# Patient Record
Sex: Male | Born: 1970 | Marital: Married | Smoking: Never smoker
Health system: Southern US, Community
[De-identification: ages and names within clinical notes are randomized; demographics above are authoritative.]

## PROBLEM LIST (undated history)

## (undated) DIAGNOSIS — N201 Calculus of ureter: Secondary | ICD-10-CM

## (undated) DIAGNOSIS — J45909 Unspecified asthma, uncomplicated: Secondary | ICD-10-CM

## (undated) DIAGNOSIS — N200 Calculus of kidney: Secondary | ICD-10-CM

## (undated) DIAGNOSIS — F419 Anxiety disorder, unspecified: Secondary | ICD-10-CM

## (undated) DIAGNOSIS — J302 Other seasonal allergic rhinitis: Secondary | ICD-10-CM

## (undated) HISTORY — DX: Anxiety disorder, unspecified: F41.9

## (undated) HISTORY — PX: THUMB ARTHROSCOPY: SHX2509

## (undated) HISTORY — DX: Calculus of kidney: N20.0

## (undated) HISTORY — PX: OTHER SURGICAL HISTORY: SHX169

## (undated) HISTORY — DX: Calculus of ureter: N20.1

## (undated) HISTORY — PX: WISDOM TOOTH EXTRACTION: SHX21

---

## 2014-12-27 ENCOUNTER — Encounter
Admission: RE | Disposition: A | Discharge status: Home / Self Care | Payer: Self-pay | Source: Ambulatory Visit | Attending: Urology

## 2014-12-27 ENCOUNTER — Ambulatory Visit
Admission: RE | Admit: 2014-12-27 | Discharge: 2014-12-27 | Disposition: A | Discharge status: Home / Self Care | Payer: BC Managed Care – PPO | Source: Ambulatory Visit | Attending: Urology | Admitting: Urology

## 2014-12-27 ENCOUNTER — Encounter: Payer: Self-pay | Admitting: *Deleted

## 2014-12-27 DIAGNOSIS — Z79899 Other long term (current) drug therapy: Secondary | ICD-10-CM | POA: Insufficient documentation

## 2014-12-27 DIAGNOSIS — Z7951 Long term (current) use of inhaled steroids: Secondary | ICD-10-CM | POA: Insufficient documentation

## 2014-12-27 DIAGNOSIS — N200 Calculus of kidney: Secondary | ICD-10-CM | POA: Diagnosis present

## 2014-12-27 HISTORY — DX: Unspecified asthma, uncomplicated: J45.909

## 2014-12-27 HISTORY — PX: EXTRACORPOREAL SHOCK WAVE LITHOTRIPSY: SHX1557

## 2014-12-27 HISTORY — DX: Other seasonal allergic rhinitis: J30.2

## 2014-12-27 SURGERY — LITHOTRIPSY, ESWL
Anesthesia: Moderate Sedation | Laterality: Left

## 2014-12-27 MED ORDER — DIAZEPAM 5 MG PO TABS
10.0000 mg | ORAL_TABLET | ORAL | Status: AC
  Administered 2014-12-27: 10 mg via ORAL

## 2014-12-27 MED ORDER — CIPROFLOXACIN HCL 500 MG PO TABS
500.0000 mg | ORAL_TABLET | ORAL | Status: AC
  Administered 2014-12-27: 500 mg via ORAL

## 2014-12-27 MED ORDER — DIAZEPAM 5 MG PO TABS
ORAL_TABLET | ORAL | Status: AC
  Administered 2014-12-27: 10 mg via ORAL
  Filled 2014-12-27: qty 2

## 2014-12-27 MED ORDER — DIPHENHYDRAMINE HCL 25 MG PO CAPS
ORAL_CAPSULE | ORAL | Status: AC
  Administered 2014-12-27: 25 mg via ORAL
  Filled 2014-12-27: qty 1

## 2014-12-27 MED ORDER — DEXTROSE-NACL 5-0.45 % IV SOLN
INTRAVENOUS | Status: DC
  Administered 2014-12-27: 07:00:00 via INTRAVENOUS

## 2014-12-27 MED ORDER — CIPROFLOXACIN HCL 500 MG PO TABS
ORAL_TABLET | ORAL | Status: AC
  Administered 2014-12-27: 500 mg via ORAL
  Filled 2014-12-27: qty 1

## 2014-12-27 MED ORDER — DIPHENHYDRAMINE HCL 25 MG PO CAPS
25.0000 mg | ORAL_CAPSULE | ORAL | Status: AC
  Administered 2014-12-27: 25 mg via ORAL

## 2014-12-28 ENCOUNTER — Other Ambulatory Visit: Payer: Self-pay | Admitting: Urology

## 2014-12-28 ENCOUNTER — Ambulatory Visit
Admission: RE | Admit: 2014-12-28 | Discharge: 2014-12-28 | Disposition: A | Discharge status: Home / Self Care | Payer: BC Managed Care – PPO | Source: Ambulatory Visit | Attending: Urology | Admitting: Urology

## 2014-12-28 DIAGNOSIS — N200 Calculus of kidney: Secondary | ICD-10-CM | POA: Diagnosis present

## 2014-12-28 MED ORDER — IOHEXOL 300 MG/ML  SOLN
125.0000 mL | Freq: Once | INTRAMUSCULAR | Status: AC | PRN
  Administered 2014-12-28: 125 mL via INTRAVENOUS

## 2015-01-04 ENCOUNTER — Encounter: Payer: Self-pay | Admitting: Urology

## 2015-01-04 NOTE — H&P (Signed)
H and P done on paper please scan into chart at hospital  This is an ESWL patient

## 2015-04-12 ENCOUNTER — Encounter: Payer: Self-pay | Admitting: Urology

## 2020-11-04 ENCOUNTER — Telehealth: Payer: Self-pay | Admitting: Urology

## 2020-11-04 NOTE — Telephone Encounter (Signed)
-----   Message from [name], MD sent at 11/03/2020  9:37 PM EDT ----- Regarding: appt One of the [facility] urologists contacted me about scheduling ESWL on this patient.  Please schedule appointment with me on Wednesday

## 2020-11-04 NOTE — Telephone Encounter (Signed)
LM TO CB TO CONFIRM APP  MICHELLE 

## 2020-11-05 ENCOUNTER — Encounter: Payer: Self-pay | Admitting: Urology

## 2020-11-05 ENCOUNTER — Ambulatory Visit: Payer: BC Managed Care – PPO | Admitting: Urology

## 2020-11-05 ENCOUNTER — Other Ambulatory Visit: Payer: Self-pay

## 2020-11-05 VITALS — BP 100/62 | HR 72 | Ht 69.0 in | Wt 160.0 lb

## 2020-11-05 DIAGNOSIS — N201 Calculus of ureter: Secondary | ICD-10-CM

## 2020-11-05 DIAGNOSIS — N200 Calculus of kidney: Secondary | ICD-10-CM | POA: Diagnosis not present

## 2020-11-05 LAB — MICROSCOPIC EXAMINATION
Bacteria, UA: NONE SEEN
Epithelial Cells (non renal): NONE SEEN /hpf (ref 0–10)

## 2020-11-05 LAB — URINALYSIS, COMPLETE
Bilirubin, UA: NEGATIVE
Glucose, UA: NEGATIVE
Ketones, UA: NEGATIVE
Nitrite, UA: NEGATIVE
Protein,UA: NEGATIVE
RBC, UA: NEGATIVE
Specific Gravity, UA: 1.01 (ref 1.005–1.030)
Urobilinogen, Ur: 0.2 mg/dL (ref 0.2–1.0)
pH, UA: 7 (ref 5.0–7.5)

## 2020-11-05 NOTE — Patient Instructions (Signed)
[employer] (25th ed., pp. 811-816). [place], [place]: [employer], Else[employer]. Retrieved from https://www.clinicalkey.com/#!/content/book/3-s2.0-B9781455750177001264?scrollTo=%23hl0000287">  Lithotripsy  Lithotripsy is a treatment that can help break up kidney stones that are too large to pass on their own. This is a nonsurgical procedure that crushes a kidney stone with shock waves. These shock waves pass through your body and focus on the kidney stone. They cause the kidney stone to break up into smaller pieces while it is still in the urinary tract. The smaller pieces of stone can pass more easily out of your body in the urine. Tell a health care provider about:  Any allergies you have.  All medicines you are taking, including vitamins, herbs, eye drops, creams, and over-the-counter medicines.  Any problems you or family members have had with anesthetic medicines.  Any blood disorders you have.  Any surgeries you have had.  Any medical conditions you have.  Whether you are pregnant or may be pregnant. What are the risks? Generally, this is a safe procedure. However, problems may occur, including:  Infection.  Bleeding from the kidney.  Bruising of the kidney or skin.  Scarring of the kidney, which can lead to: ? Increased blood pressure. ? Poor kidney function. ? Return (recurrence) of kidney stones.  Damage to other structures or organs, such as the liver, colon, spleen, or pancreas.  Blockage (obstruction) of the tube that carries urine from the kidney to the bladder (ureter).  Failure of the kidney stone to break into pieces (fragments). What happens before the procedure? Staying hydrated Follow instructions from your health care provider about hydration, which may include:  Up to 2 hours before the procedure - you may continue to drink clear liquids, such as water, clear fruit juice, black coffee, and plain tea. Eating and drinking restrictions Follow  instructions from your health care provider about eating and drinking, which may include:  8 hours before the procedure - stop eating heavy meals or foods, such as meat, fried foods, or fatty foods.  6 hours before the procedure - stop eating light meals or foods, such as toast or cereal.  6 hours before the procedure - stop drinking milk or drinks that contain milk.  2 hours before the procedure - stop drinking clear liquids. Medicines Ask your health care provider about:  Changing or stopping your regular medicines. This is especially important if you are taking diabetes medicines or blood thinners.  Taking medicines such as aspirin and ibuprofen. These medicines can thin your blood. Do not take these medicines unless your health care provider tells you to take them.  Taking over-the-counter medicines, vitamins, herbs, and supplements. Tests You may have tests, such as:  Blood tests.  Urine tests.  Imaging tests, such as a CT scan. General instructions  Plan to have someone take you home from the hospital or clinic.  If you will be going home right after the procedure, plan to have someone with you for 24 hours.  Ask your health care provider what steps will be taken to help prevent infection. These may include washing skin with a germ-killing soap. What happens during the procedure?  An IV will be inserted into one of your veins.  You will be given one or more of the following: ? A medicine to help you relax (sedative). ? A medicine to make you fall asleep (general anesthetic).  A water-filled cushion may be placed behind your kidney or on your abdomen. In some cases, you may be placed in a tub of   lukewarm water.  Your body will be positioned in a way that makes it easy to target the kidney stone.  An X-ray or ultrasound exam will be done to locate your stone.  Shock waves will be aimed at the stone. If you are awake, you may feel a tapping sensation as the shock  waves pass through your body.  A flexible tube with holes in it (stent) may be placed in the ureter. This will help keep urine flowing from the kidney if the fragments of the stone have been blocking the ureter. The procedure may vary among health care providers and hospitals.   What happens after the procedure?  You may have an X-ray to see whether the procedure was able to break up the kidney stone and how much of the stone has passed. If large stone fragments remain after treatment, you may need to have a second procedure at a later time.  Your blood pressure, heart rate, breathing rate, and blood oxygen level will be monitored until you leave the hospital or clinic.  You may be given antibiotics or pain medicine as needed.  If a stent was placed in your ureter during surgery, it may stay in place for a few weeks.  You may need to strain your urine to collect pieces of the kidney stone for testing.  You will need to drink plenty of water.  If you were given a sedative during the procedure, it can affect you for several hours. Do not drive or operate machinery until your health care provider says that it is safe. Summary  Lithotripsy is a treatment that can help break up kidney stones that are too large to pass on their own.  Lithotripsy is a nonsurgical procedure that crushes a kidney stone with shock waves.  Generally, this is a safe procedure. However, problems may occur, including damage to the kidney or other organs, infection, or obstruction of the tube that carries urine from the kidney to the bladder (ureter).  You may have a stent placed in your ureter to help drain your urine. This stent may stay in place for a few weeks.  After the procedure, you will need to drink plenty of water. You may be asked to strain your urine to collect pieces of the kidney stone for testing. This information is not intended to replace advice given to you by your health care provider. Make sure  you discuss any questions you have with your health care provider. Document Revised: 05/03/2019 Document Reviewed: 05/03/2019 Elsevier Patient Education  2021 Elsevier Inc.  

## 2020-11-05 NOTE — H&P (View-Only) (Signed)
11/05/2020 2:33 PM   Bobby Wagner [birth year] [id number]  Referring provider: No referring provider defined for this encounter.  Chief Complaint  Patient presents with  . Nephrolithiasis    HPI: Bobby Wagner is a 50 y.o. male who presents for evaluation of a right ureteral calculus.   Seen at a [facility] urgent care 10/05/2020 with 3 day history of right side pain and brownish tint to urine  Pain mild in severity  Urinalysis 2+ blood on dipstick; urine culture grew mixed flora  Started on tamsulosin and renal ultrasound ordered which was performed on 10/11/2020 and showed an 8 mm lower pole left renal calculus  Saw his PCP 10/24/2020 complaining of persistent dull pain and CT ordered and performed 10/31/2020 which showed bilateral nephrolithiasis and a 6 x 10 mm right proximal ureteral calculus with mild hydronephrosis in addition to bilateral, nonobstructing renal calculi  He is an established patient with Dr. Celso Sickle at [facility] who discussed options of SWL and ureteroscopy  Patient had a prior shockwave lithotripsy by Dr. Edwyna Shell in 2016 which was successful and he desired repeat SWL  Dr. Celso Sickle contacted me last week about scheduling SWL  Presently with minimal pain  Denies use of ASA or OTC NSAIDs   PMH: Past Medical History:  Diagnosis Date  . Anxiety   . Asthma   . Kidney stone   . Seasonal allergies   . Ureteral stone     Surgical History: Past Surgical History:  Procedure Laterality Date  . EXTRACORPOREAL SHOCK WAVE LITHOTRIPSY Left 12/27/2014   Procedure: EXTRACORPOREAL SHOCK WAVE LITHOTRIPSY (ESWL);  Surgeon: [name], MD;  Location: [facility] ORS;  Service: Urology;  Laterality: Left;  . OTHER SURGICAL HISTORY Left age 2   Thumb surgery; cut tendon sheath  . THUMB ARTHROSCOPY    . WISDOM TOOTH EXTRACTION      Home Medications:  Allergies as of 11/05/2020   No Known Allergies     Medication List       Accurate as of November 05, 2020  2:33 PM. If  you have any questions, ask your nurse or doctor.        STOP taking these medications   erythromycin ophthalmic ointment Stopped by: [name], MD   finasteride 1 MG tablet Commonly known as: PROPECIA Stopped by: [name], MD     TAKE these medications   Advair HFA 230-21 MCG/ACT inhaler Generic drug: fluticasone-salmeterol Inhale 2 puffs into the lungs 2 (two) times daily.   albuterol (2.5 MG/3ML) 0.083% nebulizer solution Commonly known as: PROVENTIL Take 2.5 mg by nebulization every 6 (six) hours as needed for wheezing or shortness of breath.   dutasteride 0.5 MG capsule Commonly known as: AVODART Take 0.5 mg by mouth daily.   fluticasone 110 MCG/ACT inhaler Commonly known as: FLOVENT HFA Inhale 1 puff into the lungs daily.   fluticasone 50 MCG/ACT nasal spray Commonly known as: FLONASE Place 1 spray into both nostrils daily.   PARoxetine 10 MG tablet Commonly known as: PAXIL Take 10 mg by mouth daily.   tamsulosin 0.4 MG Caps capsule Commonly known as: FLOMAX Take 0.4 mg by mouth daily.       Allergies: No Known Allergies  Family History: Family History  Problem Relation Age of Onset  . Kidney Stones Mother   . Kidney Stones Father   . Emphysema Father   . Prostate cancer Neg Hx   . Bladder Cancer Neg Hx   . Kidney cancer Neg Hx  Social History:  reports that he has never smoked. He has never used smokeless tobacco. He reports that he does not use drugs. No history on file for alcohol use.   Physical Exam: BP 100/62   Pulse 72   Ht 5' 9" (1.753 m)   Wt 160 lb (72.6 kg)   BMI 23.63 kg/m   Constitutional:  Alert and oriented, No acute distress. HEENT: [place] AT, moist mucus membranes.  Trachea midline, no masses. Cardiovascular: No clubbing, cyanosis, or edema. Respiratory: Normal respiratory effort, no increased work of breathing. Skin: No rashes, bruises or suspicious lesions. Neurologic: Grossly intact, no focal deficits,  moving all 4 extremities. Psychiatric: Normal mood and affect.  Laboratory Data:  Urinalysis Dipstick/microscopy negative  Pertinent Imaging: CT images personally reviewed on [facility].  Right proximal ureteral stone with density ~1200HU, SSD < 10cm.  Bilateral, nonobstructing renal calculi the largest on the left at 8 mm   Assessment & Plan:    1.  Left proximal ureteral calculus We discussed various treatment options for urolithiasis including observation with or without medical expulsive therapy, shockwave lithotripsy (SWL), ureteroscopy and laser lithotripsy with stent placement.  We discussed that management is based on stone size, location, density, patient co-morbidities, and patient preference.   Stones <5mm in size have a >80% spontaneous passage rate. Data surrounding the use of tamsulosin for medical expulsive therapy is controversial, but meta analyses suggests it is most efficacious for distal stones between 5-10mm in size.  SWL has a lower stone free rate in a single procedure, but also a lower complication rate compared to ureteroscopy and avoids a stent and associated stent related symptoms. Possible complications include renal hematoma, steinstrasse, and need for additional treatment.  Ureteroscopy with laser lithotripsy and stent placement has a higher stone free rate than SWL in a single procedure, however increased complication rate including possible infection, ureteral injury, bleeding, and stent related morbidity. Common stent related symptoms include dysuria, urgency/frequency, and flank pain.  After an extensive discussion of the risks and benefits of the above treatment options, the patient would like to proceed with shockwave lithotripsy.  Stone density is a >1000 and we discussed the possibility of poor fragmentation or incomplete fragmentation requiring repeat lithotripsy or an alternative procedure.  He did well with his lithotripsy in 2016 and stone analysis  was calcium oxalate monohydrate.   Donnelle Rubey C Harman Ferrin, MD  [facility] [address], [address] [place], [place] [address] [phone]  

## 2020-11-05 NOTE — Progress Notes (Signed)
11/05/2020 2:33 PM   Bobby Wagner [birth year] [id number]  Referring provider: No referring provider defined for this encounter.  Chief Complaint  Patient presents with  . Nephrolithiasis    HPI: Bobby Wagner is a 50 y.o. male who presents for evaluation of a right ureteral calculus.   Seen at a [facility] urgent care 10/05/2020 with 3 day history of right side pain and brownish tint to urine  Pain mild in severity  Urinalysis 2+ blood on dipstick; urine culture grew mixed flora  Started on tamsulosin and renal ultrasound ordered which was performed on 10/11/2020 and showed an 8 mm lower pole left renal calculus  Saw his PCP 10/24/2020 complaining of persistent dull pain and CT ordered and performed 10/31/2020 which showed bilateral nephrolithiasis and a 6 x 10 mm right proximal ureteral calculus with mild hydronephrosis in addition to bilateral, nonobstructing renal calculi  He is an established patient with Dr. Celso Sickle at [facility] who discussed options of SWL and ureteroscopy  Patient had a prior shockwave lithotripsy by Dr. Edwyna Shell in 2016 which was successful and he desired repeat SWL  Dr. Celso Sickle contacted me last week about scheduling SWL  Presently with minimal pain  Denies use of ASA or OTC NSAIDs   PMH: Past Medical History:  Diagnosis Date  . Anxiety   . Asthma   . Kidney stone   . Seasonal allergies   . Ureteral stone     Surgical History: Past Surgical History:  Procedure Laterality Date  . EXTRACORPOREAL SHOCK WAVE LITHOTRIPSY Left 12/27/2014   Procedure: EXTRACORPOREAL SHOCK WAVE LITHOTRIPSY (ESWL);  Surgeon: [name], MD;  Location: [facility] ORS;  Service: Urology;  Laterality: Left;  . OTHER SURGICAL HISTORY Left age 2   Thumb surgery; cut tendon sheath  . THUMB ARTHROSCOPY    . WISDOM TOOTH EXTRACTION      Home Medications:  Allergies as of 11/05/2020   No Known Allergies     Medication List       Accurate as of November 05, 2020  2:33 PM. If  you have any questions, ask your nurse or doctor.        STOP taking these medications   erythromycin ophthalmic ointment Stopped by: [name], MD   finasteride 1 MG tablet Commonly known as: PROPECIA Stopped by: [name], MD     TAKE these medications   Advair HFA 230-21 MCG/ACT inhaler Generic drug: fluticasone-salmeterol Inhale 2 puffs into the lungs 2 (two) times daily.   albuterol (2.5 MG/3ML) 0.083% nebulizer solution Commonly known as: PROVENTIL Take 2.5 mg by nebulization every 6 (six) hours as needed for wheezing or shortness of breath.   dutasteride 0.5 MG capsule Commonly known as: AVODART Take 0.5 mg by mouth daily.   fluticasone 110 MCG/ACT inhaler Commonly known as: FLOVENT HFA Inhale 1 puff into the lungs daily.   fluticasone 50 MCG/ACT nasal spray Commonly known as: FLONASE Place 1 spray into both nostrils daily.   PARoxetine 10 MG tablet Commonly known as: PAXIL Take 10 mg by mouth daily.   tamsulosin 0.4 MG Caps capsule Commonly known as: FLOMAX Take 0.4 mg by mouth daily.       Allergies: No Known Allergies  Family History: Family History  Problem Relation Age of Onset  . Kidney Stones Mother   . Kidney Stones Father   . Emphysema Father   . Prostate cancer Neg Hx   . Bladder Cancer Neg Hx   . Kidney cancer Neg Hx  Social History:  reports that he has never smoked. He has never used smokeless tobacco. He reports that he does not use drugs. No history on file for alcohol use.   Physical Exam: BP 100/62   Pulse 72   Ht 5\' 9"  (1.753 m)   Wt 160 lb (72.6 kg)   BMI 23.63 kg/m   Constitutional:  Alert and oriented, No acute distress. HEENT: [place] AT, moist mucus membranes.  Trachea midline, no masses. Cardiovascular: No clubbing, cyanosis, or edema. Respiratory: Normal respiratory effort, no increased work of breathing. Skin: No rashes, bruises or suspicious lesions. Neurologic: Grossly intact, no focal deficits,  moving all 4 extremities. Psychiatric: Normal mood and affect.  Laboratory Data:  Urinalysis Dipstick/microscopy negative  Pertinent Imaging: CT images personally reviewed on [facility].  Right proximal ureteral stone with density ~1200HU, SSD < 10cm.  Bilateral, nonobstructing renal calculi the largest on the left at 8 mm   Assessment & Plan:    1.  Left proximal ureteral calculus We discussed various treatment options for urolithiasis including observation with or without medical expulsive therapy, shockwave lithotripsy (SWL), ureteroscopy and laser lithotripsy with stent placement.  We discussed that management is based on stone size, location, density, patient co-morbidities, and patient preference.   Stones <31mm in size have a >80% spontaneous passage rate. Data surrounding the use of tamsulosin for medical expulsive therapy is controversial, but meta analyses suggests it is most efficacious for distal stones between 5-46mm in size.  SWL has a lower stone free rate in a single procedure, but also a lower complication rate compared to ureteroscopy and avoids a stent and associated stent related symptoms. Possible complications include renal hematoma, steinstrasse, and need for additional treatment.  Ureteroscopy with laser lithotripsy and stent placement has a higher stone free rate than SWL in a single procedure, however increased complication rate including possible infection, ureteral injury, bleeding, and stent related morbidity. Common stent related symptoms include dysuria, urgency/frequency, and flank pain.  After an extensive discussion of the risks and benefits of the above treatment options, the patient would like to proceed with shockwave lithotripsy.  Stone density is a >1000 and we discussed the possibility of poor fragmentation or incomplete fragmentation requiring repeat lithotripsy or an alternative procedure.  He did well with his lithotripsy in 2016 and stone analysis  was calcium oxalate monohydrate.   2017, MD  [facility] [address] [place], [place] [phone]

## 2020-11-06 ENCOUNTER — Ambulatory Visit: Payer: BC Managed Care – PPO | Admitting: Urology

## 2020-11-06 ENCOUNTER — Other Ambulatory Visit: Payer: Self-pay | Admitting: Urology

## 2020-11-06 ENCOUNTER — Other Ambulatory Visit
Admission: RE | Admit: 2020-11-06 | Discharge: 2020-11-06 | Disposition: A | Discharge status: Home / Self Care | Payer: BC Managed Care – PPO | Source: Ambulatory Visit | Attending: Urology | Admitting: Urology

## 2020-11-06 DIAGNOSIS — N200 Calculus of kidney: Secondary | ICD-10-CM | POA: Diagnosis not present

## 2020-11-06 DIAGNOSIS — N201 Calculus of ureter: Secondary | ICD-10-CM | POA: Diagnosis not present

## 2020-11-06 DIAGNOSIS — Z87442 Personal history of urinary calculi: Secondary | ICD-10-CM | POA: Diagnosis not present

## 2020-11-06 DIAGNOSIS — Z7951 Long term (current) use of inhaled steroids: Secondary | ICD-10-CM | POA: Diagnosis not present

## 2020-11-06 DIAGNOSIS — Z79899 Other long term (current) drug therapy: Secondary | ICD-10-CM | POA: Diagnosis not present

## 2020-11-06 DIAGNOSIS — Z20822 Contact with and (suspected) exposure to covid-19: Secondary | ICD-10-CM | POA: Insufficient documentation

## 2020-11-06 DIAGNOSIS — Z01812 Encounter for preprocedural laboratory examination: Secondary | ICD-10-CM | POA: Insufficient documentation

## 2020-11-06 LAB — SARS CORONAVIRUS 2 (TAT 6-24 HRS): SARS Coronavirus 2: NEGATIVE

## 2020-11-06 MED ORDER — CEPHALEXIN 250 MG PO CAPS: 500.0000 mg | ORAL_CAPSULE | ORAL | Status: AC

## 2020-11-07 ENCOUNTER — Other Ambulatory Visit: Payer: Self-pay

## 2020-11-07 ENCOUNTER — Encounter: Payer: Self-pay | Admitting: Urology

## 2020-11-07 ENCOUNTER — Ambulatory Visit
Admission: RE | Admit: 2020-11-07 | Discharge: 2020-11-07 | Disposition: A | Discharge status: Home / Self Care | Payer: BC Managed Care – PPO | Attending: Urology | Admitting: Urology

## 2020-11-07 ENCOUNTER — Ambulatory Visit: Payer: BC Managed Care – PPO

## 2020-11-07 ENCOUNTER — Encounter
Admission: RE | Disposition: A | Discharge status: Home / Self Care | Payer: Self-pay | Source: Home / Self Care | Attending: Urology

## 2020-11-07 DIAGNOSIS — Z7951 Long term (current) use of inhaled steroids: Secondary | ICD-10-CM | POA: Insufficient documentation

## 2020-11-07 DIAGNOSIS — Z01818 Encounter for other preprocedural examination: Secondary | ICD-10-CM

## 2020-11-07 DIAGNOSIS — N201 Calculus of ureter: Secondary | ICD-10-CM | POA: Diagnosis not present

## 2020-11-07 DIAGNOSIS — Z79899 Other long term (current) drug therapy: Secondary | ICD-10-CM | POA: Insufficient documentation

## 2020-11-07 DIAGNOSIS — Z87442 Personal history of urinary calculi: Secondary | ICD-10-CM | POA: Insufficient documentation

## 2020-11-07 DIAGNOSIS — N200 Calculus of kidney: Secondary | ICD-10-CM | POA: Insufficient documentation

## 2020-11-07 DIAGNOSIS — Z20822 Contact with and (suspected) exposure to covid-19: Secondary | ICD-10-CM | POA: Insufficient documentation

## 2020-11-07 HISTORY — PX: EXTRACORPOREAL SHOCK WAVE LITHOTRIPSY: SHX1557

## 2020-11-07 SURGERY — LITHOTRIPSY, ESWL
Anesthesia: Moderate Sedation | Laterality: Right

## 2020-11-07 MED ORDER — ONDANSETRON HCL 4 MG/2ML IJ SOLN
INTRAMUSCULAR | Status: AC
  Administered 2020-11-07: 4 mg via INTRAVENOUS
  Filled 2020-11-07: qty 2

## 2020-11-07 MED ORDER — CEPHALEXIN 500 MG PO CAPS
ORAL_CAPSULE | ORAL | Status: AC
  Administered 2020-11-07: 500 mg via ORAL
  Filled 2020-11-07: qty 1

## 2020-11-07 MED ORDER — SODIUM CHLORIDE 0.9 % IV SOLN: Freq: Once | INTRAVENOUS | Status: DC

## 2020-11-07 MED ORDER — ONDANSETRON HCL 4 MG/2ML IJ SOLN: 4.0000 mg | Freq: Once | INTRAMUSCULAR | Status: DC | PRN

## 2020-11-07 MED ORDER — DIPHENHYDRAMINE HCL 25 MG PO CAPS
ORAL_CAPSULE | ORAL | Status: AC
  Administered 2020-11-07: 25 mg via ORAL
  Filled 2020-11-07: qty 1

## 2020-11-07 MED ORDER — CEPHALEXIN 500 MG PO CAPS: 500.0000 mg | ORAL_CAPSULE | Freq: Once | ORAL | Status: AC

## 2020-11-07 MED ORDER — DIAZEPAM 5 MG PO TABS
ORAL_TABLET | ORAL | Status: AC
  Administered 2020-11-07: 10 mg via ORAL
  Filled 2020-11-07: qty 2

## 2020-11-07 MED ORDER — ONDANSETRON HCL 4 MG/2ML IJ SOLN: 4.0000 mg | Freq: Once | INTRAMUSCULAR | Status: AC

## 2020-11-07 MED ORDER — DIAZEPAM 5 MG PO TABS: 10.0000 mg | ORAL_TABLET | ORAL | Status: AC

## 2020-11-07 MED ORDER — SODIUM CHLORIDE 0.9 % IV SOLN: INTRAVENOUS | Status: DC

## 2020-11-07 MED ORDER — OXYCODONE-ACETAMINOPHEN 5-325 MG PO TABS: 1.0000 | ORAL_TABLET | ORAL | 0 refills | Status: DC | PRN

## 2020-11-07 MED ORDER — DIAZEPAM 5 MG PO TABS: 10.0000 mg | ORAL_TABLET | Freq: Once | ORAL | Status: DC

## 2020-11-07 MED ORDER — DIPHENHYDRAMINE HCL 25 MG PO CAPS: 25.0000 mg | ORAL_CAPSULE | ORAL | Status: AC

## 2020-11-07 MED ORDER — DIPHENHYDRAMINE HCL 25 MG PO CAPS: 25.0000 mg | ORAL_CAPSULE | Freq: Once | ORAL | Status: DC

## 2020-11-07 NOTE — Interval H&P Note (Signed)
History and Physical Interval Note:  11/07/2020 10:48 AM  Bobby Wagner  has presented today for surgery, with the diagnosis of right proximal ureteral calculus.  The various methods of treatment have been discussed with the patient and family. After consideration of risks, benefits and other options for treatment, the patient has consented to  Procedure(s): EXTRACORPOREAL SHOCK WAVE LITHOTRIPSY (ESWL) (Right) as a surgical intervention.  The patient's history has been reviewed, patient examined, no change in status, stable for surgery.  I have reviewed the patient's chart and labs.  Questions were answered to the patient's satisfaction.    RRR CTAB   [name]

## 2020-11-08 ENCOUNTER — Telehealth: Payer: Self-pay

## 2020-11-08 ENCOUNTER — Encounter: Payer: Self-pay | Admitting: Urology

## 2020-11-08 DIAGNOSIS — Z87442 Personal history of urinary calculi: Secondary | ICD-10-CM

## 2020-11-08 NOTE — Telephone Encounter (Signed)
Patient called post ESWL yesterday wanting to know how well his stone fragmented during the procedure. He is trying to get a better understanding on what to expect for pain/discofort for stone passage over the next few days. It was explained that he can expect to pass fragments for possibly up to two weeks post procedure and should keep follow up with KUB to confirm stone passage. He should continue to push fluids to aid in stone passage and strain urine.

## 2020-11-08 NOTE — Telephone Encounter (Signed)
Patient notified

## 2020-11-08 NOTE — Telephone Encounter (Signed)
Yes, the stone fragmented fairly well but it was a large stone so they will be lots of fragments.  There is a possibility he may need another treatment down the road depending on how his KUB in 2 weeks look.  Advice provided was appropriate.  [name], MD

## 2020-11-11 ENCOUNTER — Encounter: Payer: Self-pay | Admitting: Urology

## 2020-11-11 ENCOUNTER — Telehealth: Payer: Self-pay

## 2020-11-11 ENCOUNTER — Other Ambulatory Visit: Payer: Self-pay

## 2020-11-11 ENCOUNTER — Ambulatory Visit
Admission: RE | Admit: 2020-11-11 | Discharge: 2020-11-11 | Disposition: A | Discharge status: Home / Self Care | Payer: BC Managed Care – PPO | Attending: Urology | Admitting: Urology

## 2020-11-11 ENCOUNTER — Ambulatory Visit (INDEPENDENT_AMBULATORY_CARE_PROVIDER_SITE_OTHER): Payer: BC Managed Care – PPO | Admitting: Urology

## 2020-11-11 ENCOUNTER — Ambulatory Visit
Admission: RE | Admit: 2020-11-11 | Discharge: 2020-11-11 | Disposition: A | Discharge status: Home / Self Care | Payer: BC Managed Care – PPO | Source: Ambulatory Visit | Attending: Urology | Admitting: Urology

## 2020-11-11 VITALS — BP 121/75 | HR 80

## 2020-11-11 DIAGNOSIS — Z87442 Personal history of urinary calculi: Secondary | ICD-10-CM | POA: Diagnosis not present

## 2020-11-11 DIAGNOSIS — N200 Calculus of kidney: Secondary | ICD-10-CM

## 2020-11-11 NOTE — Progress Notes (Signed)
11/11/2020 12:59 PM   Bobby Wagner 01/21/1971 [id number]  Referring provider: Katrinka Blazing, MD [address] [place],  [place] [address]  Chief Complaint  Patient presents with  . Nephrolithiasis    HPI: 50 year old male returns sooner than expected due to concerns for ongoing right flank pain and nonpassage of stone fragment following lithotripsy on Thursday.  He had an 11 mm right UPJ stone which fragmented fairly well at the time of ESWL.  Postoperatively, he did okay but over the weekend has had intermittent episodes of severe right flank pain which comes and goes.  Right now is very comfortable but when the pain hits him, he will experience associated nausea and the pain can be quite severe.  He denies any dysuria, fevers or chills.  No dysuria or gross hematuria.  He has been looking closely for fragments and has not seen any past.  He reports that last time he had this procedure done, he saw fragments almost immediately for the course of 2 days and then it resolved.  He is worried that his stone did not break up.  He has been taking ibuprofen around-the-clock but is only needed to take for doses of narcotic.  He does not want a refill at this moment.  KUB today was personally reviewed.  Compared to his preprocedure KUB, the stone is in fact fragmented with 2 larger fragments that have settled in the right lower pole, largest 4 mm.  He is also actively passing fragments 2 to 3 mm in size primarily seen in the proximal ureter.   PMH: Past Medical History:  Diagnosis Date  . Anxiety   . Asthma   . Kidney stone   . Seasonal allergies   . Ureteral stone     Surgical History: Past Surgical History:  Procedure Laterality Date  . EXTRACORPOREAL SHOCK WAVE LITHOTRIPSY Left 12/27/2014   Procedure: EXTRACORPOREAL SHOCK WAVE LITHOTRIPSY (ESWL);  Surgeon: [name], MD;  Location: [facility]S;  Service: Urology;  Laterality: Left;  . EXTRACORPOREAL SHOCK  WAVE LITHOTRIPSY Right 11/07/2020   Procedure: EXTRACORPOREAL SHOCK WAVE LITHOTRIPSY (ESWL);  Surgeon: [name], MD;  Location: [facility] OR[facility];  Service: Urology;  Laterality: Right;  . OTHER SURGICAL HISTORY Left age 1   Thumb surgery; cut tendon sheath  . THUMB ARTHROSCOPY    . WISDOM TOOTH EXTRACTION      Home Medications:  Allergies as of 11/11/2020   No Known Allergies     Medication List       Accurate as of November 11, 2020 12:59 PM. If you have any questions, ask your nurse or doctor.        Advair HFA 230-21 MCG/ACT inhaler Generic drug: fluticasone-salmeterol Inhale 2 puffs into the lungs 2 (two) times daily.   albuterol (2.5 MG/3ML) 0.083% nebulizer solution Commonly known as: PROVENTIL Take 2.5 mg by nebulization every 6 (six) hours as needed for wheezing or shortness of breath.   dutasteride 0.5 MG capsule Commonly known as: AVODART Take 0.5 mg by mouth daily.   fluticasone 110 MCG/ACT inhaler Commonly known as: FLOVENT HFA Inhale 1 puff into the lungs daily.   fluticasone 50 MCG/ACT nasal spray Commonly known as: FLONASE Place 1 spray into both nostrils daily.   oxyCODONE-acetaminophen 5-325 MG tablet Commonly known as: Percocet Take 1-2 tablets by mouth every 4 (four) hours as needed for moderate pain or severe pain.   PARoxetine 10 MG tablet Commonly known as: PAXIL Take 10 mg by mouth  daily.   tamsulosin 0.4 MG Caps capsule Commonly known as: FLOMAX Take 0.4 mg by mouth daily.       Allergies: No Known Allergies  Family History: Family History  Problem Relation Age of Onset  . Kidney Stones Mother   . Kidney Stones Father   . Emphysema Father   . Prostate cancer Neg Hx   . Bladder Cancer Neg Hx   . Kidney cancer Neg Hx     Social History:  reports that he has never smoked. He has never used smokeless tobacco. He reports that he does not use drugs. No history on file for alcohol use.   Physical Exam: BP 121/75   Pulse 80    Constitutional:  Alert and oriented, No acute distress. HEENT:  AT, moist mucus membranes.  Trachea midline, no masses. Cardiovascular: No clubbing, cyanosis, or edema. Respiratory: Normal respiratory effort, no increased work of breathing. Skin: No rashes, bruises or suspicious lesions. Neurologic: Grossly intact, no focal deficits, moving all 4 extremities. Psychiatric: Normal mood and affect.  Laboratory Data: KUB images were personally reviewed as outlined above  Assessment & Plan:    1. Nephrolithiasis Status post right ESWL from 11 mm stone  Interval KUB does in fact show fragmentation with passage of stone fragment both in the ureter as well as smaller fragments in the lower pole  Based on the size of the fragments, likely that he will be able to pass these on his own.  He is quite comfortable today in clinic.  He was reassured, encouraged hydration and continued expectant management.  Warning symptoms reviewed extensively.  All questions answered. - Urinalysis, Complete  [name], MD  [facility] [address] [place], [place] [address] [phone]

## 2020-11-11 NOTE — Addendum Note (Signed)
Addended by: Frankey Shown on: 11/11/2020 09:41 AM   Modules accepted: Orders

## 2020-11-11 NOTE — Telephone Encounter (Signed)
Incoming call from pt on triage line, he states that he has yet to pass any stone fragments and is very concerned about this. He states that he is now having constant flank pain, he questions what his next step should be. Please advise.

## 2020-11-11 NOTE — Telephone Encounter (Signed)
Spoke with Dr. Apolinar Junes via secure chat, she recommends that if pt is having pain he should come into the office to be assessed with KUB prior and possible Toradol injection. KUB ordered. Patient called and informed of the KUB, appointment scheduled for 11/11/20 at 10:30am.

## 2020-11-11 NOTE — Telephone Encounter (Signed)
ERROR. SEE PREVIOUS TELEPHONE ENCOUNTER.

## 2020-11-12 LAB — URINALYSIS, COMPLETE
Bilirubin, UA: NEGATIVE
Glucose, UA: NEGATIVE
Leukocytes,UA: NEGATIVE
Nitrite, UA: NEGATIVE
Protein,UA: NEGATIVE
RBC, UA: NEGATIVE
Specific Gravity, UA: 1.02 (ref 1.005–1.030)
Urobilinogen, Ur: 0.2 mg/dL (ref 0.2–1.0)
pH, UA: 6 (ref 5.0–7.5)

## 2020-11-12 LAB — MICROSCOPIC EXAMINATION
Bacteria, UA: NONE SEEN
RBC, Urine: NONE SEEN /hpf (ref 0–2)

## 2020-11-13 ENCOUNTER — Encounter: Payer: Self-pay | Admitting: Urology

## 2020-11-13 ENCOUNTER — Other Ambulatory Visit: Payer: Self-pay | Admitting: Urology

## 2020-11-13 DIAGNOSIS — N200 Calculus of kidney: Secondary | ICD-10-CM

## 2020-11-13 MED ORDER — OXYCODONE-ACETAMINOPHEN 5-325 MG PO TABS: 1.0000 | ORAL_TABLET | ORAL | 0 refills | Status: AC | PRN

## 2020-11-18 ENCOUNTER — Other Ambulatory Visit: Payer: Self-pay

## 2020-11-18 ENCOUNTER — Ambulatory Visit
Admission: RE | Admit: 2020-11-18 | Discharge: 2020-11-18 | Disposition: A | Discharge status: Home / Self Care | Payer: BC Managed Care – PPO | Attending: Urology | Admitting: Urology

## 2020-11-18 ENCOUNTER — Telehealth: Payer: Self-pay | Admitting: Urology

## 2020-11-18 ENCOUNTER — Other Ambulatory Visit: Payer: Self-pay | Admitting: Urology

## 2020-11-18 ENCOUNTER — Ambulatory Visit
Admission: RE | Admit: 2020-11-18 | Discharge: 2020-11-18 | Disposition: A | Discharge status: Home / Self Care | Payer: BC Managed Care – PPO | Source: Ambulatory Visit | Attending: Urology | Admitting: Urology

## 2020-11-18 DIAGNOSIS — N200 Calculus of kidney: Secondary | ICD-10-CM | POA: Insufficient documentation

## 2020-11-18 NOTE — Telephone Encounter (Signed)
Informed patient-reviewed in detail instructions. Patient prefers to get KUB done this afternoon at [facility]. Called [facility] to verify order-Patient confirmed virtual appointment 11/19/20 at 845am verified NPO-voiced understanding for tentative plan.

## 2020-11-18 NOTE — Telephone Encounter (Signed)
Can you add this guy to my schedule tomorrow at 845?  He is having a lot of pain from his ureteral fragments.  Of asked him to get a KUB beforehand.  It can be in person or he can go get his x-ray today in [place] and a virtual visit.  Also, please have him be n.p.o. at midnight just in case we decide to try to add him on tomorrow afternoon.  [name], MD

## 2020-11-19 ENCOUNTER — Telehealth (INDEPENDENT_AMBULATORY_CARE_PROVIDER_SITE_OTHER): Payer: BC Managed Care – PPO | Admitting: Urology

## 2020-11-19 DIAGNOSIS — N200 Calculus of kidney: Secondary | ICD-10-CM | POA: Diagnosis not present

## 2020-11-19 DIAGNOSIS — N201 Calculus of ureter: Secondary | ICD-10-CM | POA: Diagnosis not present

## 2020-11-19 NOTE — Progress Notes (Signed)
Virtual Visit via Video Note  I connected with Bobby Wagner on 11/19/20 at  8:45 AM EDT by a video enabled telemedicine application and verified that I am speaking with the correct person using two identifiers.  Location: Patient: Home Provider: Office   I discussed the limitations of evaluation and management by telemedicine and the availability of in person appointments. The patient expressed understanding and agreed to proceed.  History of Present Illness: 50 year old male who presents today for further evaluation of right flank pain.  He had a large 11 mm right UPJ stone for which he underwent right ESWL on 11/07/2020.  At the time, the stone was noted to fragment.  He was seen in follow-up on 412 for ongoing flank pain none passage of stone fragment.  This revealed multiple fragments up to 4 mm both within the kidney as well as in the proximal ureter.  Since his treatment, he is continue to struggle with flank pain and reports has not seen pieces passed.  KUB performed yesterday evening showed interval distal migration of his ureteral fragments which are retained.  Renal fragments are unchanged.   Largest ureteral calculus approximately 4 mm.  He reports that he has been able to manage his pain a little bit better over the past several days.  His pain is now moved into the right lower quadrant symptoms radiate to his testicle.  He still not seeing any stone fragments passed.  He has been alternating Tylenol and Motrin during the day and having to use oxycodone occasionally at nighttime.   Observations/Objective: Appears comfortable  Assessment and Plan:  1. Ureteral calculus, right Interval further distal migration of ureteral fragments, now within the distal third of the ureter with the largest leading stone about 4 mm.  There is also been some interval spread proximal to this leading stone measuring up to 1.6 cm in length.  Renal stone fragments unchanged.  Given his ongoing  severe pain, he was offered 3 alternatives today.  Continued observation was an option with a good chance that he will be able to spontaneously pass the stones especially given for further distal migration as with large stone only 4 mm.  We also discussed the option of ureteroscopy, laser lithotripsy and ureteral stent placement, risk and benefits as well as success rate for this.  He is hesitant about the ureteral stent.  Lastly, given interval distal migration of the stone ureteral stones, I could offer him ESWL as soon as this Thursday to treat his distal ureteral fragments.  He will think about this and let us know.  Otherwise, continue supportive care, warning symptoms reviewed/  2. Nephrolithiasis Kidney stones, bilateral nonobstructing.  Consider treatment down the road.   Follow Up Instructions: Reschedule follow-up for in 2 weeks with KUB or sooner as needed if he elects ESWL   I discussed the assessment and treatment plan with the patient. The patient was provided an opportunity to ask questions and all were answered. The patient agreed with the plan and demonstrated an understanding of the instructions.   The patient was advised to call back or seek an in-person evaluation if the symptoms worsen or if the condition fails to improve as anticipated.  I provided 25 minutes of non-face-to-face time during this encounter.  I spent 30 total minutes on the day of the encounter including pre-visit review of the medical record, face-to-face time with the patient, and post visit ordering of labs/imaging/tests.  Majority of today's visit was spent engaging with the  patient directly.  I shared my screen with him and reviewed all of his previous imaging including his serial KUB x3.   [name], MD

## 2020-11-19 NOTE — Progress Notes (Signed)
This service is provided via telemedicine   No vital signs collected/recorded due to the encounter was a telemedicine visit.     Patient consents to a telephone visit:  Yes    Names of all persons participating in the telemedicine service and their role in the encounter:  Quetzaly Ebner O`Sullivan, RMA   

## 2020-11-20 NOTE — Progress Notes (Deleted)
11/21/2020 8:28 AM   Ellis Parents Sep 28, 1970 [id number]  Referring provider: No referring provider defined for this encounter.  No chief complaint on file.  Urological history: 1. Nephrolithiasis -stone composition 100% calcium oxalate monohydrate -ESWL x several    HPI: ***   Reviewed referral notes.    PMH: Past Medical History:  Diagnosis Date  . Anxiety   . Asthma   . Kidney stone   . Seasonal allergies   . Ureteral stone     Surgical History: Past Surgical History:  Procedure Laterality Date  . EXTRACORPOREAL SHOCK WAVE LITHOTRIPSY Left 12/27/2014   Procedure: EXTRACORPOREAL SHOCK WAVE LITHOTRIPSY (ESWL);  Surgeon: [name], MD;  Location: [facility];  Service: Urology;  Laterality: Left;  . EXTRACORPOREAL SHOCK WAVE LITHOTRIPSY Right 11/07/2020   Procedure: EXTRACORPOREAL SHOCK WAVE LITHOTRIPSY (ESWL);  Surgeon: [name], MD;  Location: [facility];  Service: Urology;  Laterality: Right;  . OTHER SURGICAL HISTORY Left age 59   Thumb surgery; cut tendon sheath  . THUMB ARTHROSCOPY    . WISDOM TOOTH EXTRACTION      Home Medications:  Allergies as of 11/21/2020   No Known Allergies     Medication List       Accurate as of November 20, 2020  8:28 AM. If you have any questions, ask your nurse or doctor.        Advair HFA 230-21 MCG/ACT inhaler Generic drug: fluticasone-salmeterol Inhale 2 puffs into the lungs 2 (two) times daily.   albuterol (2.5 MG/3ML) 0.083% nebulizer solution Commonly known as: PROVENTIL Take 2.5 mg by nebulization every 6 (six) hours as needed for wheezing or shortness of breath.   dutasteride 0.5 MG capsule Commonly known as: AVODART Take 0.5 mg by mouth daily.   fluticasone 110 MCG/ACT inhaler Commonly known as: FLOVENT HFA Inhale 1 puff into the lungs daily.   fluticasone 50 MCG/ACT nasal spray Commonly known as: FLONASE Place 1 spray into both nostrils daily.   oxyCODONE-acetaminophen 5-325 MG  tablet Commonly known as: Percocet Take 1-2 tablets by mouth every 4 (four) hours as needed for moderate pain or severe pain.   PARoxetine 10 MG tablet Commonly known as: PAXIL Take 10 mg by mouth daily.   tamsulosin 0.4 MG Caps capsule Commonly known as: FLOMAX Take 0.4 mg by mouth daily.       Allergies: No Known Allergies  Family History: Family History  Problem Relation Age of Onset  . Kidney Stones Mother   . Kidney Stones Father   . Emphysema Father   . Prostate cancer Neg Hx   . Bladder Cancer Neg Hx   . Kidney cancer Neg Hx     Social History:  reports that he has never smoked. He has never used smokeless tobacco. He reports that he does not use drugs. No history on file for alcohol use.  ROS: Pertinent ROS in HPI  Physical Exam: There were no vitals taken for this visit.  Constitutional:  Well nourished. Alert and oriented, No acute distress. HEENT: [place] AT, moist mucus membranes.  Trachea midline, no masses. Cardiovascular: No clubbing, cyanosis, or edema. Respiratory: Normal respiratory effort, no increased work of breathing. GI: Abdomen is soft, non tender, non distended, no abdominal masses. Liver and spleen not palpable.  No hernias appreciated.  Stool sample for occult testing is not indicated.   GU: No CVA tenderness.  No bladder fullness or masses.  Patient with circumcised/uncircumcised phallus. ***Foreskin easily retracted***  Urethral meatus is patent.  No penile discharge. No penile lesions or rashes. Scrotum without lesions, cysts, rashes and/or edema.  Testicles are located scrotally bilaterally. No masses are appreciated in the testicles. Left and right epididymis are normal. Rectal: Patient with  normal sphincter tone. Anus and perineum without scarring or rashes. No rectal masses are appreciated. Prostate is approximately *** grams, *** nodules are appreciated. Seminal vesicles are normal. Skin: No rashes, bruises or suspicious lesions. Lymph: No  cervical or inguinal adenopathy. Neurologic: Grossly intact, no focal deficits, moving all 4 extremities. Psychiatric: Normal mood and affect.  Laboratory Data: No results found for: WBC, HGB, HCT, MCV, PLT  No results found for: CREATININE  No results found for: PSA  No results found for: TESTOSTERONE  No results found for: HGBA1C  No results found for: TSH  No results found for: CHOL, HDL, CHOLHDL, VLDL, LDLCALC  No results found for: AST No results found for: ALT No components found for: ALKALINEPHOPHATASE No components found for: BILIRUBINTOTAL  No results found for: ESTRADIOL  Urinalysis Component     Latest Ref Rng & Units 11/11/2020  Specific Gravity, UA     1.005 - 1.030 1.020  pH, UA     5.0 - 7.5 6.0  Color, UA     Yellow Yellow  Appearance Ur     Clear Clear  Leukocytes,UA     Negative Negative  Protein,UA     Negative/Trace Negative  Glucose, UA     Negative Negative  Ketones, UA     Negative Trace (A)  RBC, UA     Negative Negative  Bilirubin, UA     Negative Negative  Urobilinogen, Ur     0.2 - 1.0 mg/dL 0.2  Nitrite, UA     Negative Negative  Microscopic Examination      See below:   Component     Latest Ref Rng & Units 11/11/2020  WBC, UA     0 - 5 /hpf 6-10 (A)  RBC     0 - 2 /hpf None seen  Epithelial Cells (non renal)     0 - 10 /hpf 0-10  Casts     None seen /lpf Present (A)  Cast Type     N/A Hyaline casts  Bacteria, UA     None seen/Few None seen  I have reviewed the labs.   Pertinent Imaging: *** I have independently reviewed the films.    Assessment & Plan:  ***  1. Nephrolithiasis ***  No follow-ups on file.  These notes generated with voice recognition software. I apologize for typographical errors.  Michiel Cowboy, PA-C  [facility] [address]  [address] [place], [place] [address] [phone]

## 2020-11-21 ENCOUNTER — Telehealth: Payer: Self-pay | Admitting: Urology

## 2020-11-21 ENCOUNTER — Other Ambulatory Visit: Payer: Self-pay

## 2020-11-21 ENCOUNTER — Ambulatory Visit: Payer: BC Managed Care – PPO | Admitting: Urology

## 2020-11-21 DIAGNOSIS — N201 Calculus of ureter: Secondary | ICD-10-CM

## 2020-11-21 DIAGNOSIS — N200 Calculus of kidney: Secondary | ICD-10-CM

## 2020-11-21 NOTE — Telephone Encounter (Signed)
Please call Mr. Kiehn and get him scheduled for 2-week follow-up with Dr. Apolinar Junes with a KUB prior.

## 2020-11-27 ENCOUNTER — Other Ambulatory Visit: Payer: Self-pay

## 2020-11-27 ENCOUNTER — Ambulatory Visit
Admission: RE | Admit: 2020-11-27 | Discharge: 2020-11-27 | Disposition: A | Discharge status: Home / Self Care | Payer: BC Managed Care – PPO | Attending: Urology | Admitting: Urology

## 2020-11-27 ENCOUNTER — Ambulatory Visit
Admission: RE | Admit: 2020-11-27 | Discharge: 2020-11-27 | Disposition: A | Discharge status: Home / Self Care | Payer: BC Managed Care – PPO | Source: Ambulatory Visit | Attending: Urology | Admitting: Urology

## 2020-11-27 DIAGNOSIS — N200 Calculus of kidney: Secondary | ICD-10-CM | POA: Insufficient documentation

## 2020-11-27 NOTE — Telephone Encounter (Signed)
Per Dr. Apolinar Junes will KUB depending on results may add on for litho. Patient advised. KUB ordered in [place]. Patient voiced understanding.

## 2020-12-11 ENCOUNTER — Ambulatory Visit
Admission: RE | Admit: 2020-12-11 | Discharge: 2020-12-11 | Disposition: A | Discharge status: Home / Self Care | Payer: BC Managed Care – PPO | Source: Ambulatory Visit | Attending: Urology | Admitting: Urology

## 2020-12-11 ENCOUNTER — Other Ambulatory Visit: Payer: Self-pay

## 2020-12-11 ENCOUNTER — Ambulatory Visit (INDEPENDENT_AMBULATORY_CARE_PROVIDER_SITE_OTHER): Payer: BC Managed Care – PPO | Admitting: Urology

## 2020-12-11 ENCOUNTER — Encounter: Payer: Self-pay | Admitting: Urology

## 2020-12-11 VITALS — BP 137/82 | HR 97

## 2020-12-11 DIAGNOSIS — N201 Calculus of ureter: Secondary | ICD-10-CM

## 2020-12-11 DIAGNOSIS — N200 Calculus of kidney: Secondary | ICD-10-CM

## 2020-12-11 NOTE — Progress Notes (Signed)
12/11/2020 11:01 AM   Bobby Wagner November 09, 1970 [id number]  Referring provider: Katrinka Blazing, MD [address] [place],  [place] [address]  Chief Complaint  Patient presents with  . Nephrolithiasis    HPI: 50 year old male with a personal history of 11 mm right UPJ stone status post ESWL on 11/07/2020 who returns today for follow-up.  He has been seen multiple times both virtually and in person for failure to pass interval fragments.  He had a series of KUB showing fragmentation of the stone and distal migration of fragments but some of been retained.  He reports over Thursday and Friday, he had increasing urinary symptoms and left lower quadrant pain.  This lasted for about 2 days.  It then resolved.  Yesterday, he passed at least 3 or 4 fragments, a smaller 1 which he brought with him today.  He is not having any discomfort.  KUB today shows a persistent 3 mm right lower pole fragment as well as 3 to 4 mm left distal ureteral fragment.  There continues to be interval passage of previously appreciated fragments.    PMH: Past Medical History:  Diagnosis Date  . Anxiety   . Asthma   . Kidney stone   . Seasonal allergies   . Ureteral stone     Surgical History: Past Surgical History:  Procedure Laterality Date  . EXTRACORPOREAL SHOCK WAVE LITHOTRIPSY Left 12/27/2014   Procedure: EXTRACORPOREAL SHOCK WAVE LITHOTRIPSY (ESWL);  Surgeon: [name], MD;  Location: [facility];  Service: Urology;  Laterality: Left;  . EXTRACORPOREAL SHOCK WAVE LITHOTRIPSY Right 11/07/2020   Procedure: EXTRACORPOREAL SHOCK WAVE LITHOTRIPSY (ESWL);  Surgeon: [name], MD;  Location: [facility];  Service: Urology;  Laterality: Right;  . OTHER SURGICAL HISTORY Left age 13   Thumb surgery; cut tendon sheath  . THUMB ARTHROSCOPY    . WISDOM TOOTH EXTRACTION      Home Medications:  Allergies as of 12/11/2020   No Known Allergies     Medication List       Accurate as of Dec 11, 2020 11:01 AM. If you have any questions, ask your nurse or doctor.        STOP taking these medications   tamsulosin 0.4 MG Caps capsule Commonly known as: FLOMAX Stopped by: [name], MD     TAKE these medications   Advair HFA 230-21 MCG/ACT inhaler Generic drug: fluticasone-salmeterol Inhale 2 puffs into the lungs 2 (two) times daily.   albuterol (2.5 MG/3ML) 0.083% nebulizer solution Commonly known as: PROVENTIL Take 2.5 mg by nebulization every 6 (six) hours as needed for wheezing or shortness of breath.   dutasteride 0.5 MG capsule Commonly known as: AVODART Take 0.5 mg by mouth daily.   fluticasone 110 MCG/ACT inhaler Commonly known as: FLOVENT HFA Inhale 1 puff into the lungs daily.   fluticasone 50 MCG/ACT nasal spray Commonly known as: FLONASE Place 1 spray into both nostrils daily.   oxyCODONE-acetaminophen 5-325 MG tablet Commonly known as: Percocet Take 1-2 tablets by mouth every 4 (four) hours as needed for moderate pain or severe pain.   PARoxetine 10 MG tablet Commonly known as: PAXIL Take 10 mg by mouth daily.       Allergies: No Known Allergies  Family History: Family History  Problem Relation Age of Onset  . Kidney Stones Mother   . Kidney Stones Father   . Emphysema Father   . Prostate cancer Neg Hx   . Bladder Cancer Neg  Hx   . Kidney cancer Neg Hx     Social History:  reports that he has never smoked. He has never used smokeless tobacco. He reports that he does not use drugs. No history on file for alcohol use.   Physical Exam: BP 137/82   Pulse 97   Constitutional:  Alert and oriented, No acute distress. HEENT: [place] AT, moist mucus membranes.  Trachea midline, no masses. Cardiovascular: No clubbing, cyanosis, or edema. Respiratory: Normal respiratory effort, no increased work of breathing. Skin: No rashes, bruises or suspicious lesions. Neurologic: Grossly intact, no focal deficits, moving all 4  extremities. Psychiatric: Normal mood and affect.  Assessment & Plan:    1. Nephrolithiasis Persistent 3 to 4 mm right distal ureteral fragment but has had some recent late success in passing multiple larger fragments in the past week noted on today's KUB  Given that he is not having any severe pain, recommended continue observation with repeat KUB in about 4 weeks.  If he passes his stone in the interim, we can hold off as this appears to be the only single fragment remaining other than a nonobstructing 3 mm fragment.  We discussed whether not to send his stone for analysis, previously consisted of calcium oxalate.  He declined setting this.  We discussed general stone prevention techniques including drinking plenty water with goal of producing 2.5 L urine daily, increased citric acid intake, avoidance of high oxalate containing foods, and decreased salt intake.  Information about dietary recommendations given today.  We also discussed whether or not to pursue metabolic evaluation, discussed [facility] and he declined.  Lastly, we discussed his nonobstructing left-sided stone.  He like to hold off and treating this 1 for at least a year unless he becomes symptomatic.  If he does pass the fragment outlined above, we will see him back in a year with a KUB.  Warning symptoms reviewed - Abdomen 1 view (KUB); Future   [name], MD  [facility] [address] [place], [place] [address] [phone]

## 2021-01-11 ENCOUNTER — Encounter: Payer: Self-pay | Admitting: Urology

## 2021-02-12 ENCOUNTER — Ambulatory Visit: Payer: Self-pay | Admitting: Urology

## 2022-04-23 IMAGING — CR DG ABDOMEN 1V
2 series · 2 of 2 positions shown · non-contrast
Comparison: December 28, 2014.

CLINICAL DATA: Nephrolithiasis.

EXAM:
ABDOMEN - 1 VIEW

[abdomen kub (1 of 2)]
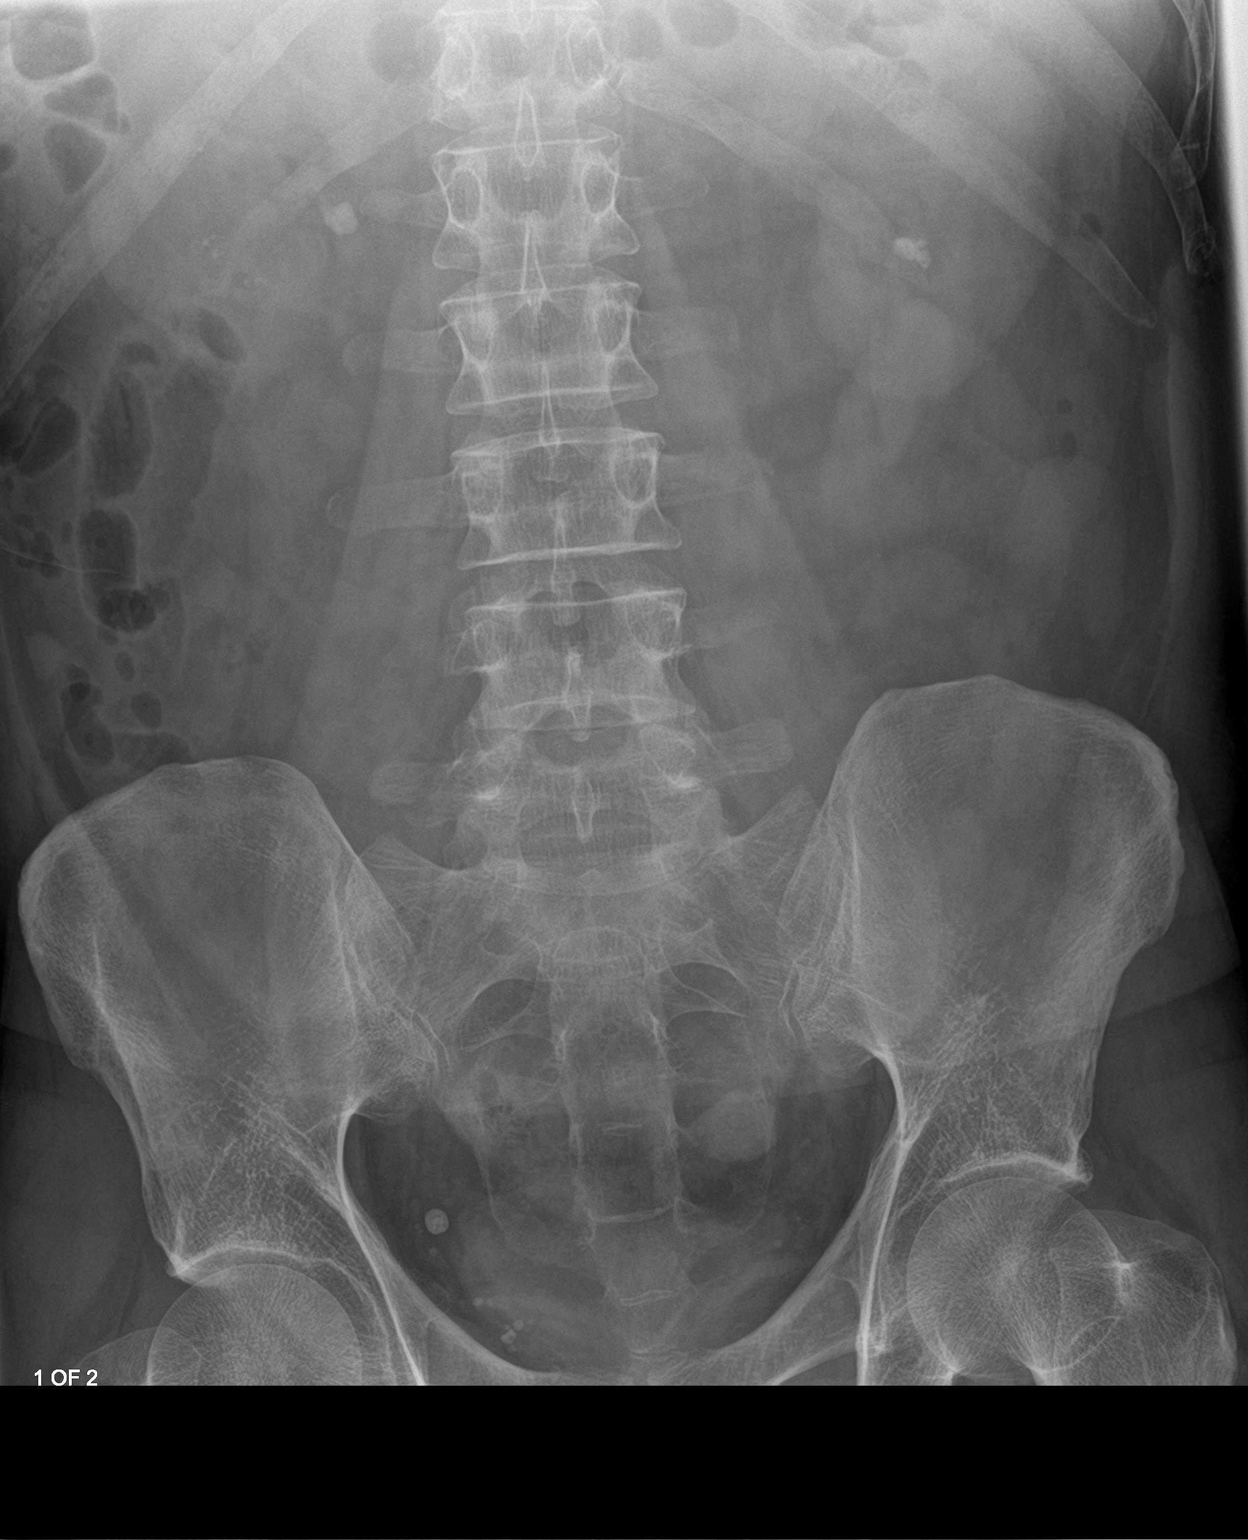

[abdomen kub (2 of 2)]
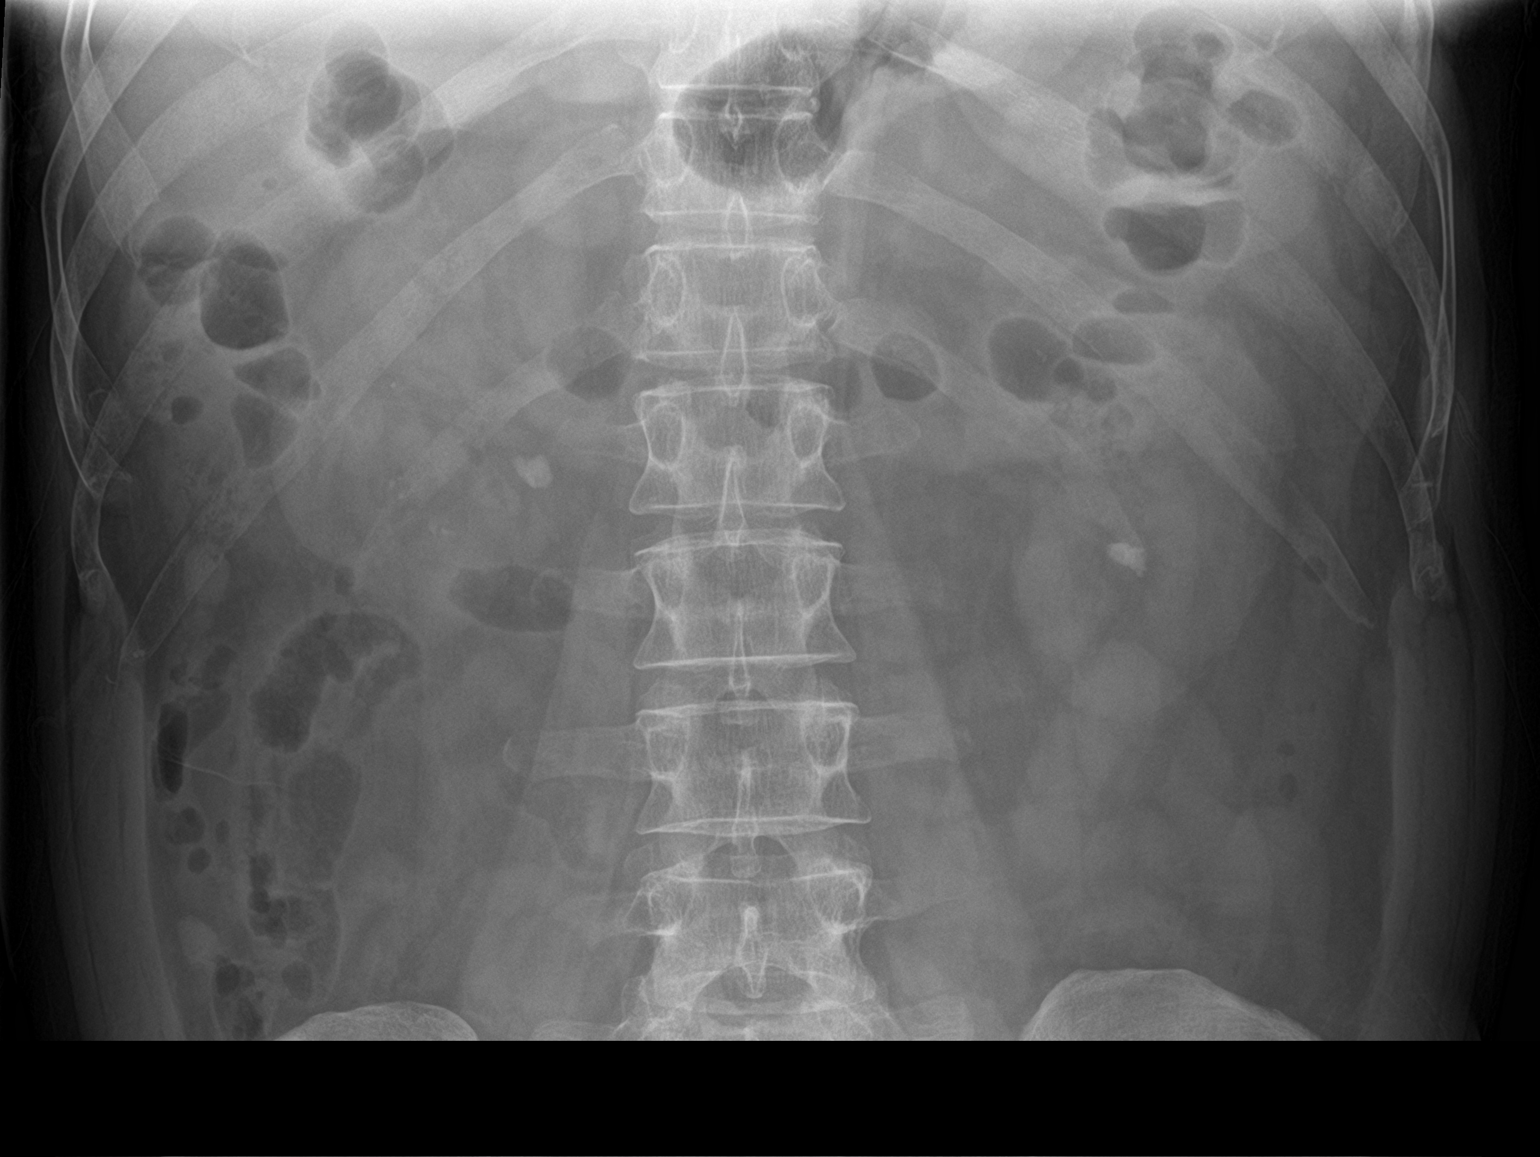

[2 of 2 positions shown; findings below may reference images not displayed]

FINDINGS: The bowel gas pattern is normal. Bilateral nephrolithiasis is noted.
Largest calculus on the left measures 11 mm. Largest calculus on the
right also measures 11 mm.
IMPRESSION: Bilateral nephrolithiasis.

## 2022-04-27 IMAGING — CR DG ABDOMEN 1V
2 series · 2 of 2 positions shown · non-contrast
Comparison: KUB 11/07/2020.

CLINICAL DATA: Right flank pain. History of urinary tract stones.
Status post lithotripsy on the right 11/07/2020.

EXAM:
ABDOMEN - 1 VIEW

[abdomen kub (1 of 2)]
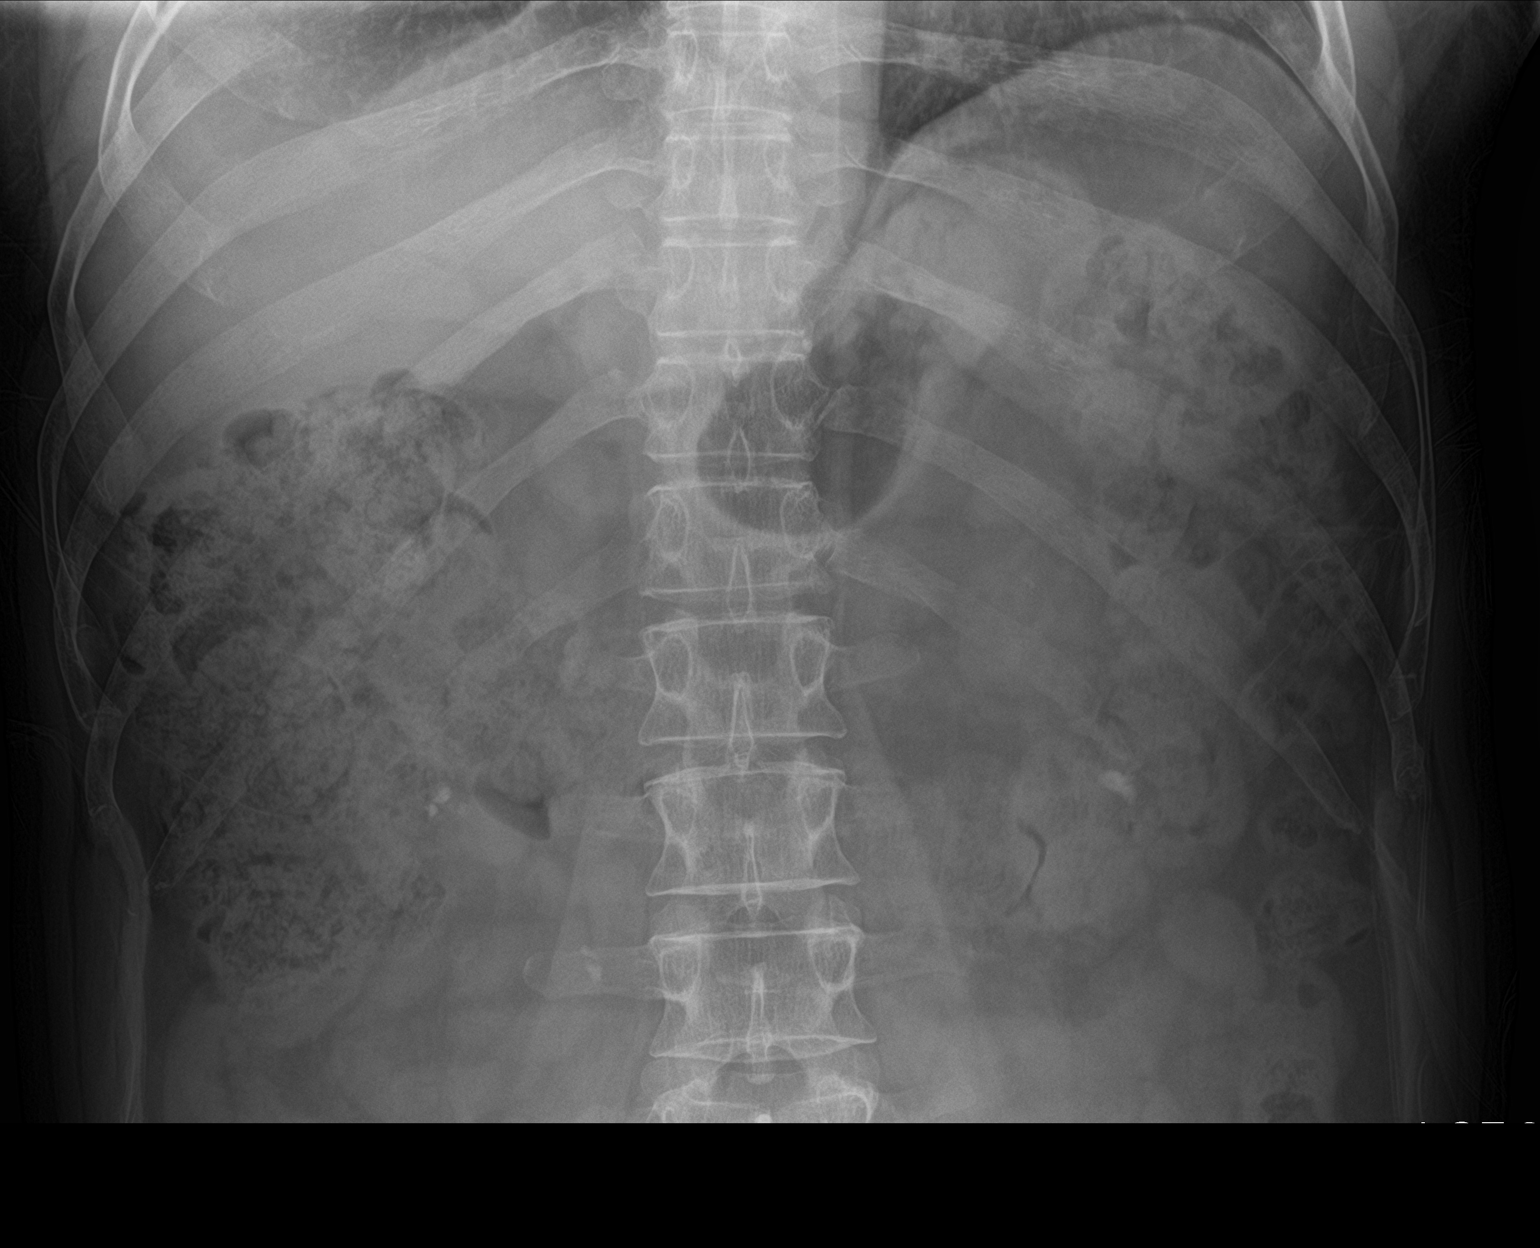

[abdomen kub (2 of 2)]
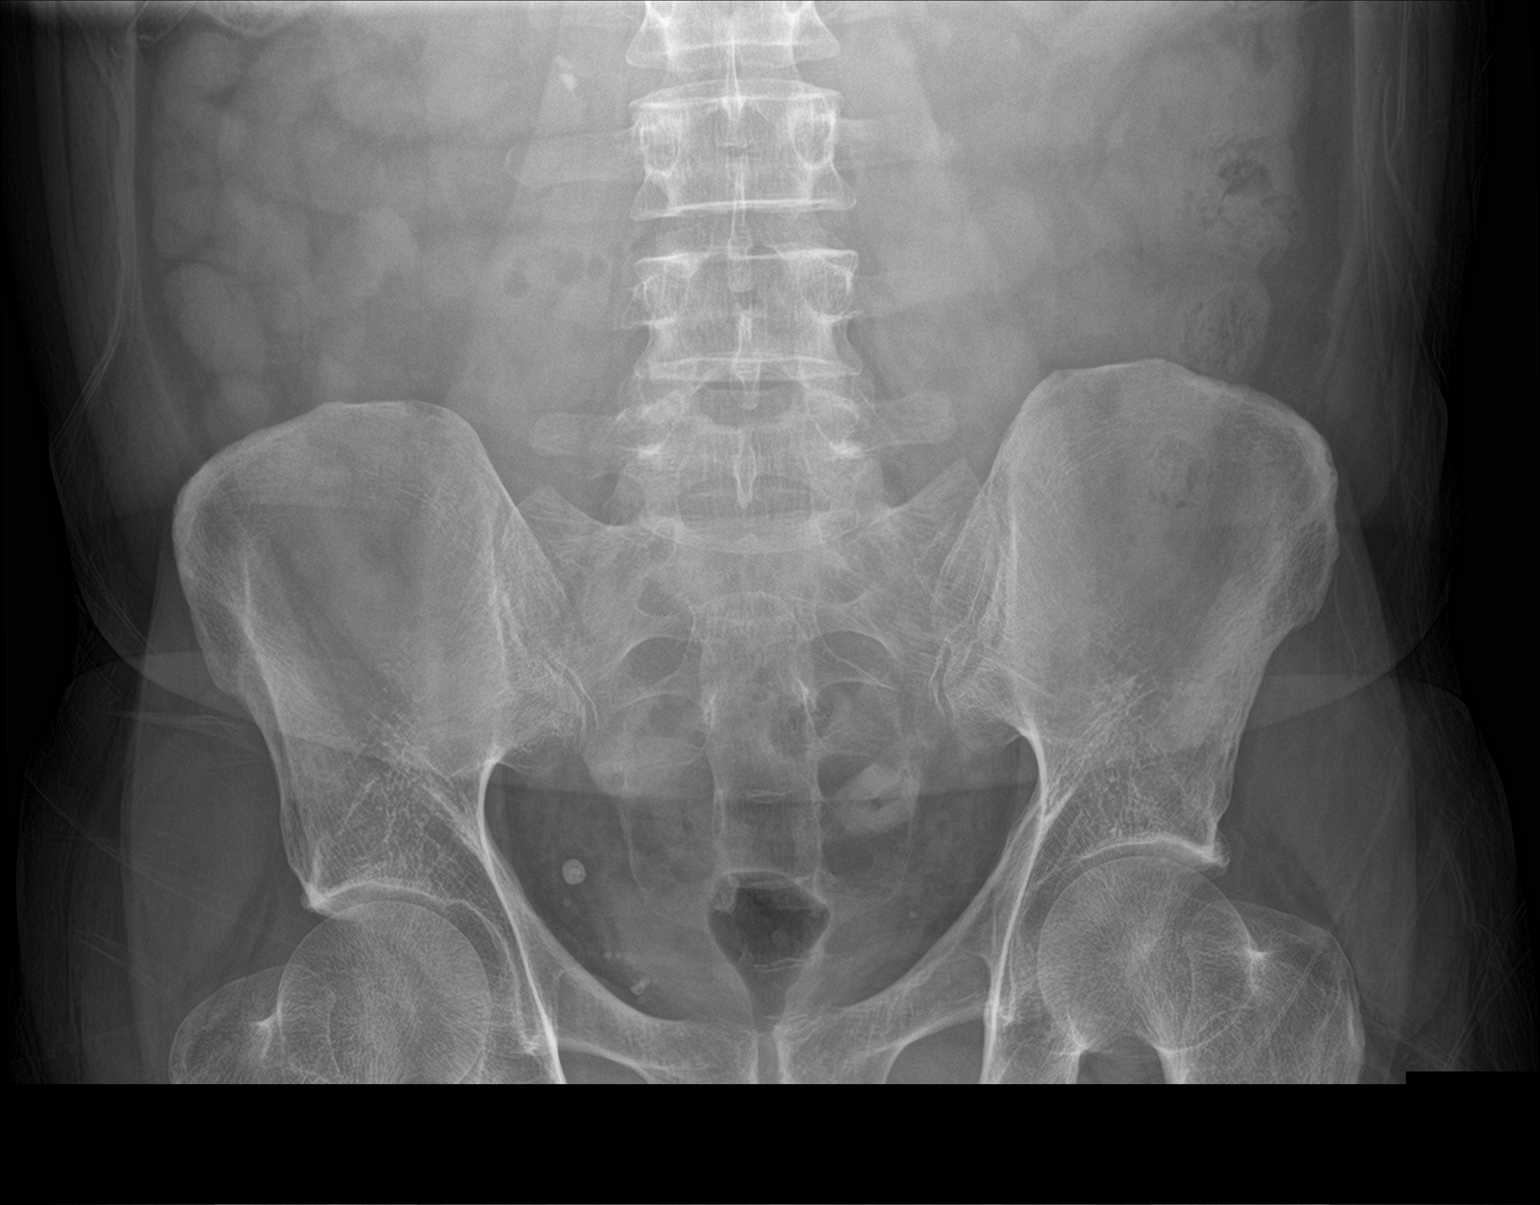

[2 of 2 positions shown; findings below may reference images not displayed]

FINDINGS: The patient has undergone right lithotripsy since the prior
examination. Previously seen large stone projecting at the right L1
transverse process is now fragmented. Two calcifications projecting
over the right L3 transverse process measuring up to 0.4 cm in
diameter are consistent with ureteral stones. 3-4 calcifications are
seen in the lower pole of the right kidney. A 0.9 cm stone in the
left kidney is unchanged. Large colonic stool burden is noted.
IMPRESSION: Large stone projecting at the right L1 transverse process seen on
the prior KUB is now fragmented. Two ureteral stones on the right at
the level of the L4 transverse process measure up to 0.4 cm are new
since the prior KUB.

Bilateral renal stones as described above.

Large colonic stool burden.

## 2022-05-13 IMAGING — CR DG ABDOMEN 1V
2 series · 2 of 2 positions shown · non-contrast
Comparison: 11/18/2020.

CLINICAL DATA: History of stones.  Lithotripsy 3 weeks ago.

EXAM:
ABDOMEN - 1 VIEW

[abdomen kub (1 of 2)]
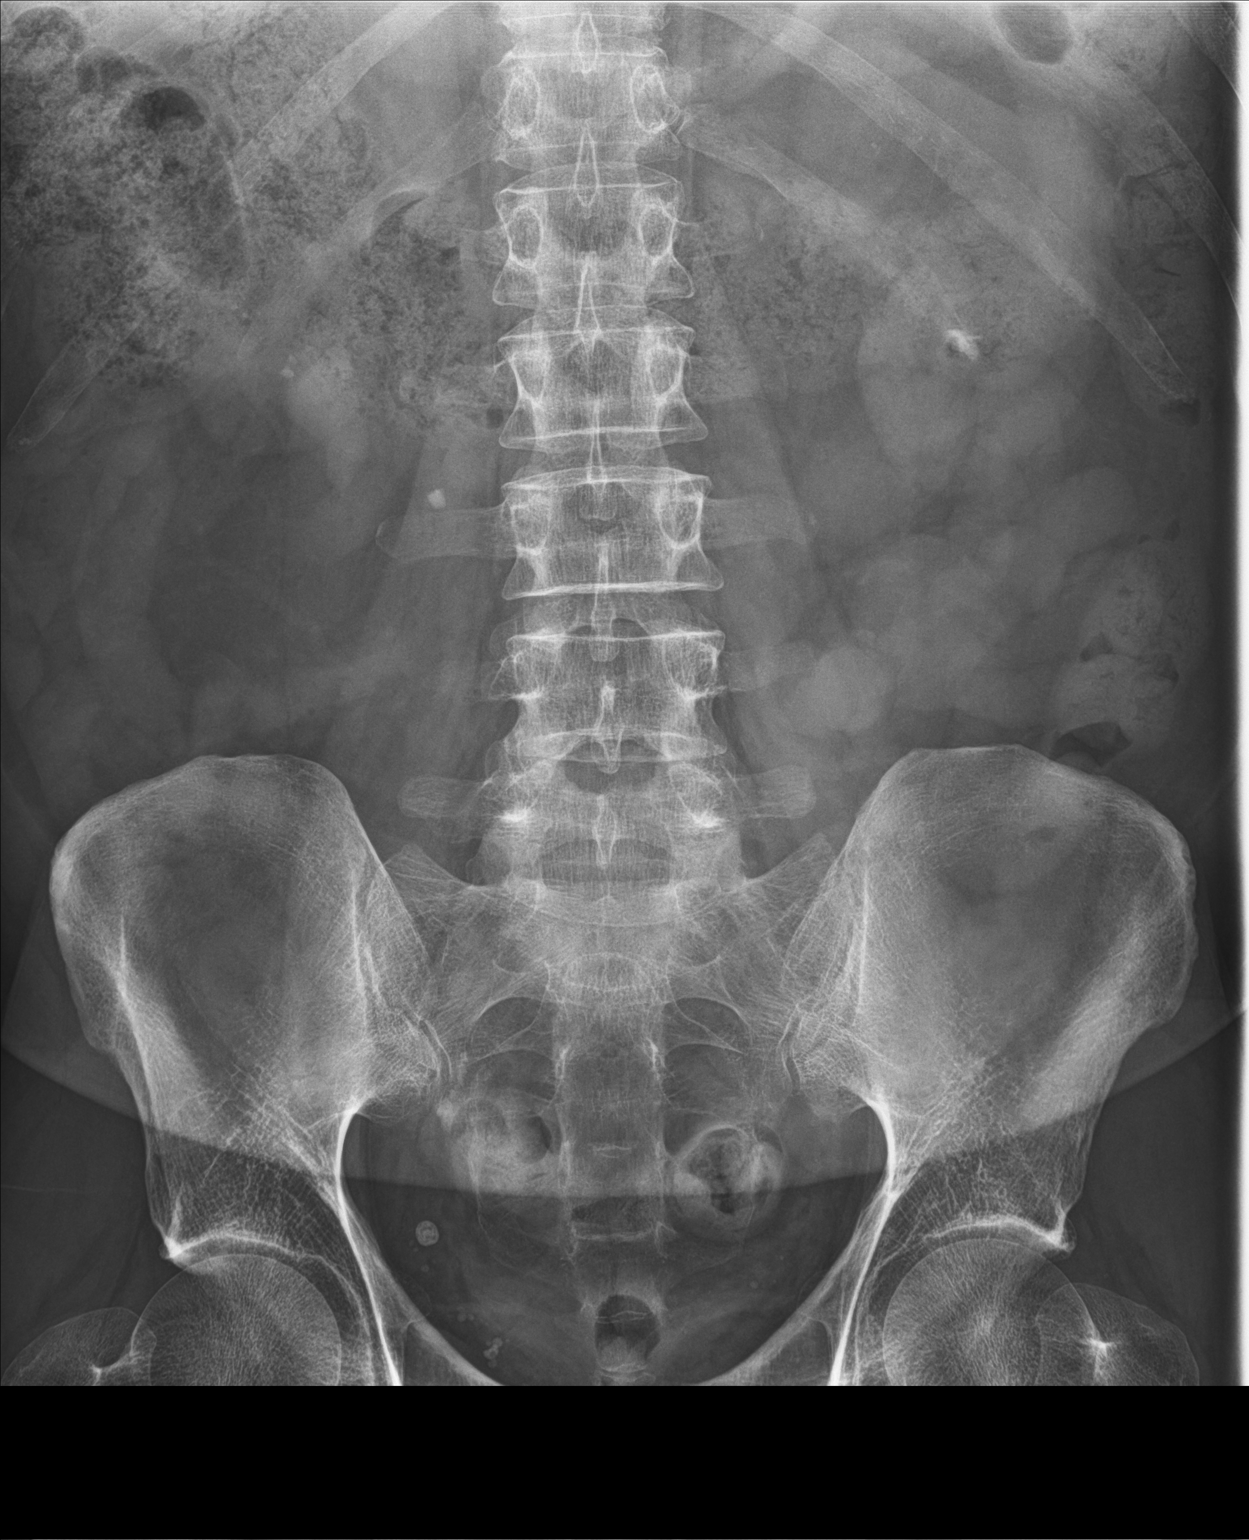

[abdomen kub (2 of 2)]
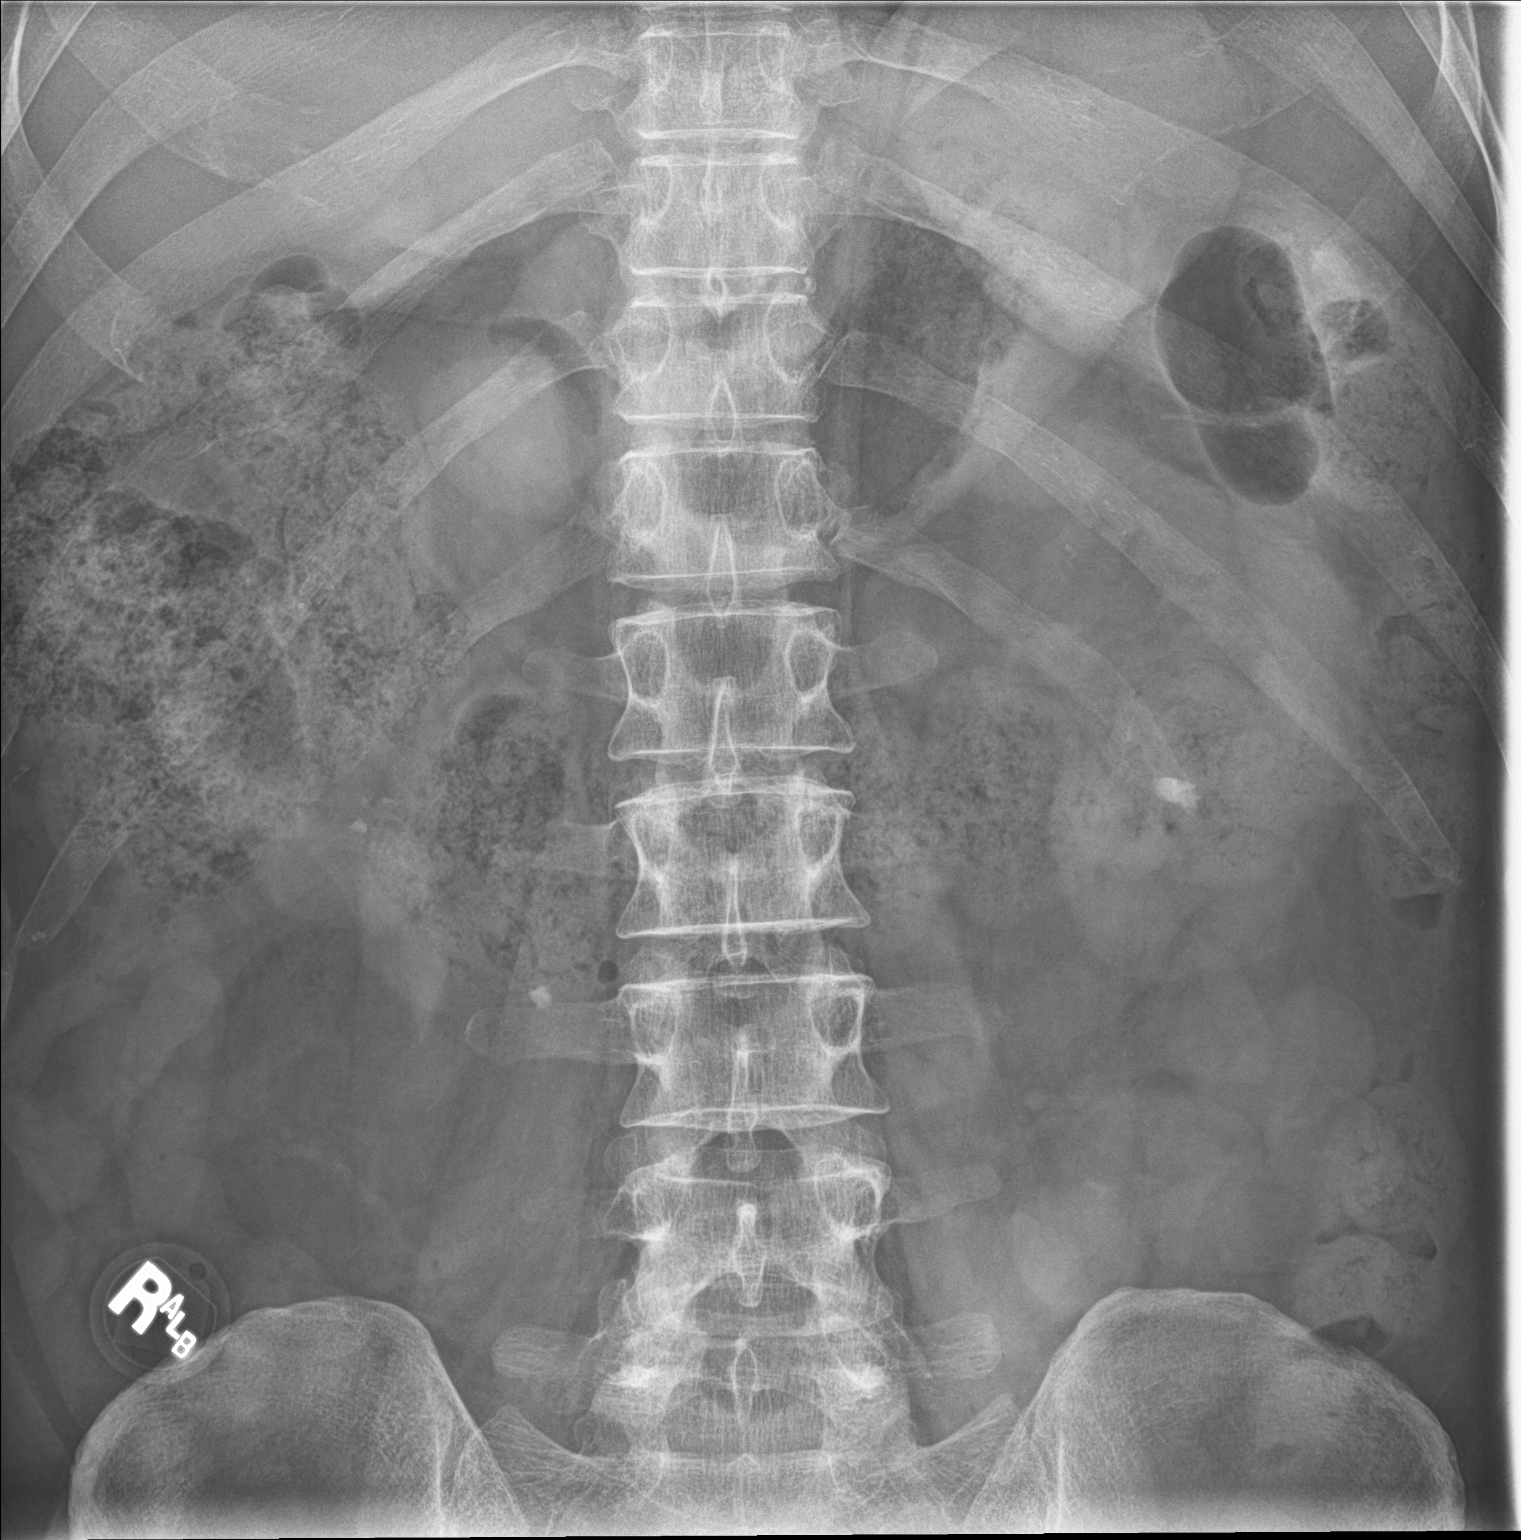

[2 of 2 positions shown; findings below may reference images not displayed]

FINDINGS: Multiple tiny stone fragments again noted over the right kidney. 4
mm stone fragment noted over the right upper ureter. Questionable
stone fragments noted over the right lower ureter. Prominent stone
again noted over the left kidney. Multiple pelvic calcifications
appear stable and most likely phleboliths. No bowel distention.
Stool noted throughout the colon. No acute bony abnormality.
IMPRESSION: 1. Multiple tiny stone fragments again noted over the right kidney.
4 mm stone fragment noted over the right upper ureter. Questionable
stone fragments noted over the right lower ureter.

2.  Prominent stone again noted over the left kidney.
